# Patient Record
Sex: Male | Born: 1970 | Race: White | Hispanic: No | Marital: Married | Smoking: Never smoker
Health system: Southern US, Community
[De-identification: ages and names within clinical notes are randomized; demographics above are authoritative.]

## PROBLEM LIST (undated history)

## (undated) HISTORY — PX: PENECTOMY: SHX741

## (undated) HISTORY — PX: KNEE SURGERY: SHX244

## (undated) HISTORY — PX: OTHER SURGICAL HISTORY: SHX169

## (undated) HISTORY — PX: HAND SURGERY: SHX662

---

## 1999-04-24 ENCOUNTER — Other Ambulatory Visit: Admission: RE | Admit: 1999-04-24 | Discharge: 1999-04-24 | Payer: Self-pay | Admitting: Orthopedic Surgery

## 1999-10-21 ENCOUNTER — Emergency Department (HOSPITAL_COMMUNITY): Admission: EM | Admit: 1999-10-21 | Discharge: 1999-10-22 | Payer: Self-pay | Admitting: Emergency Medicine

## 1999-10-21 ENCOUNTER — Encounter: Payer: Self-pay | Admitting: Emergency Medicine

## 2004-09-12 ENCOUNTER — Ambulatory Visit (HOSPITAL_COMMUNITY): Admission: RE | Admit: 2004-09-12 | Discharge: 2004-09-12 | Payer: Self-pay | Admitting: *Deleted

## 2004-10-01 ENCOUNTER — Ambulatory Visit (HOSPITAL_BASED_OUTPATIENT_CLINIC_OR_DEPARTMENT_OTHER): Admission: RE | Admit: 2004-10-01 | Discharge: 2004-10-01 | Payer: Self-pay | Admitting: *Deleted

## 2007-10-26 ENCOUNTER — Emergency Department (HOSPITAL_COMMUNITY): Admission: EM | Admit: 2007-10-26 | Discharge: 2007-10-26 | Payer: Self-pay | Admitting: Emergency Medicine

## 2009-08-13 ENCOUNTER — Emergency Department (HOSPITAL_COMMUNITY): Admission: EM | Admit: 2009-08-13 | Discharge: 2009-08-13 | Payer: Self-pay | Admitting: Emergency Medicine

## 2010-09-07 LAB — DIFFERENTIAL
Basophils Absolute: 0 10*3/uL (ref 0.0–0.1)
Basophils Relative: 1 % (ref 0–1)
Eosinophils Absolute: 0.1 10*3/uL (ref 0.0–0.7)
Neutrophils Relative %: 54 % (ref 43–77)

## 2010-09-07 LAB — POCT CARDIAC MARKERS
CKMB, poc: 2 ng/mL (ref 1.0–8.0)
Troponin i, poc: <0.05 ng/mL (ref 0.00–0.09)

## 2010-09-07 LAB — URINALYSIS, ROUTINE W REFLEX MICROSCOPIC
Bilirubin Urine: NEGATIVE
Glucose, UA: NEGATIVE mg/dL
Nitrite: NEGATIVE
Specific Gravity, Urine: 1.011 (ref 1.005–1.030)
pH: 7 (ref 5.0–8.0)

## 2010-09-07 LAB — COMPREHENSIVE METABOLIC PANEL
ALT: 31 U/L (ref 0–53)
Alkaline Phosphatase: 72 U/L (ref 39–117)
BUN: 8 mg/dL (ref 6–23)
CO2: 29 mEq/L (ref 19–32)
GFR calc non Af Amer: >60 mL/min (ref >60)
Glucose, Bld: 99 mg/dL (ref 70–99)
Potassium: 4 mEq/L (ref 3.5–5.1)
Sodium: 138 mEq/L (ref 135–145)

## 2010-09-07 LAB — CBC
HCT: 46.1 % (ref 39.0–52.0)
Hemoglobin: 15.3 g/dL (ref 13.0–17.0)
MCHC: 33.1 g/dL (ref 30.0–36.0)
RBC: 5.15 MIL/uL (ref 4.22–5.81)

## 2010-09-07 LAB — LIPASE, BLOOD: Lipase: 68 U/L — ABNORMAL HIGH (ref 11–59)

## 2010-10-31 NOTE — Op Note (Signed)
NAMEFAHEEM, ZIEMANN NO.:  [id number]   MEDICAL RECORD NO.:  [id number]          PATIENT TYPE:  AMB   LOCATION:  DSC                          FACILITY:  [facility]   PHYSICIAN:  Lowell Bouton, M.D.DATE OF BIRTH:  [birth year]   DATE OF PROCEDURE:  10/01/2004  DATE OF DISCHARGE:                                 OPERATIVE REPORT   PREOPERATIVE DIAGNOSES:  1.  Hook of the hamate fracture, right wrist.  2.  Right carpal tunnel syndrome.   POSTOPERATIVE DIAGNOSES:  1.  Hook of the hamate fracture, right wrist.  2.  Right carpal tunnel syndrome.   PROCEDURE:  1.  Excision of hook of the hamate bone, right wrist.  2.  Right carpal tunnel release.   SURGEON:  Lowell Bouton, M.D.   ANESTHESIA:  General.   OPERATIVE FINDINGS:  The patient had a nonunion of a hamate fracture. There  was obvious looseness of the hook of the hamate. The carpal tunnel showed  significant fibrosis of the nerve with no masses identified and canal. The  motor branch of the nerve was intact.   PROCEDURE:  Under general anesthesia with a tourniquet on the right arm, the  right hand was prepped and draped in usual fashion. After exsanguinating the  limb, the tourniquet was inflated to 200 mmHg.  A V-shaped incision was made  over the hypothenar eminence overlying the hamate. Sharp dissection was  carried through the subcutaneous tissues and bleeding points were  coagulated. Blunt dissection was carried down to the neurovascular bundle  and the radial branches of the artery were coagulated with electrocautery.  This allowed for exposure of the hook of the hamate. The hypothenar palmaris  brevis muscle was divided to get down to the hamate bone. Blunt dissection  was carried along the ulnar nerve and the motor branch was identified,  diving down around the hook of the hamate. It was protected throughout the  procedure. The hook of the hamate was obviously loose and by sharp  dissection subperiosteally, the fragment was excised. A rongeur was used to  completely excise the remaining bone of the hook of the hamate. The base  showed evidence of nonunion. The wound was then irrigated.  A nerve  stimulator was used to test the ulnar motor branch function and it was  functioning. At this point it was felt that a second incision should be used  for the carpal tunnel release and so a 3 cm longitudinal incision was made  just along the thenar crease. Sharp dissection was carried through the  subcutaneous tissues. Bleeding points were coagulated. Blunt dissection was  carried down through the superficial palmar fascia and the transverse carpal  ligament was divided sharply. The division of the ligament was done on the  ulnar side of median nerve.  Proximally, the ligament was divided after  dissecting the nerve away from the undersurface of the ligament. The carpal  canal was then palpated and was found to be adequately decompressed. The  nerve was examined and the motor branch was identified. Both wounds were  irrigated  with saline and the carpal tunnel incision was closed with 4-0  nylon sutures. The second incision was  drained with a vessel loop drain and closed with 4-0 nylon sutures. Sterile  dressings were applied followed by a volar wrist splint. The tourniquet was  released with good circulation of the hand. The patient went to the recovery  room awake and stable in good condition.      EMM/MEDQ  D:  10/01/2004  T:  10/01/2004  Job:  [id number]

## 2011-10-13 ENCOUNTER — Encounter (HOSPITAL_COMMUNITY): Payer: Self-pay | Admitting: Emergency Medicine

## 2011-10-13 ENCOUNTER — Emergency Department (HOSPITAL_COMMUNITY)
Admission: EM | Admit: 2011-10-13 | Discharge: 2011-10-13 | Disposition: A | Discharge status: Home / Self Care | Payer: Managed Care, Other (non HMO) | Attending: Emergency Medicine | Admitting: Emergency Medicine

## 2011-10-13 DIAGNOSIS — M545 Low back pain, unspecified: Secondary | ICD-10-CM | POA: Insufficient documentation

## 2011-10-13 DIAGNOSIS — M549 Dorsalgia, unspecified: Secondary | ICD-10-CM | POA: Insufficient documentation

## 2011-10-13 MED ORDER — DIAZEPAM 5 MG PO TABS: 5.0000 mg | ORAL_TABLET | Freq: Two times a day (BID) | ORAL | Status: AC

## 2011-10-13 MED ORDER — ONDANSETRON 8 MG PO TBDP
8.0000 mg | ORAL_TABLET | Freq: Once | ORAL | Status: AC
  Administered 2011-10-13: 8 mg via ORAL
  Filled 2011-10-13: qty 1

## 2011-10-13 MED ORDER — ONDANSETRON HCL 4 MG PO TABS: 4.0000 mg | ORAL_TABLET | Freq: Four times a day (QID) | ORAL | Status: AC

## 2011-10-13 MED ORDER — HYDROMORPHONE HCL PF 2 MG/ML IJ SOLN
2.0000 mg | Freq: Once | INTRAMUSCULAR | Status: AC
  Administered 2011-10-13: 2 mg via INTRAVENOUS
  Filled 2011-10-13: qty 1

## 2011-10-13 MED ORDER — OXYCODONE-ACETAMINOPHEN 5-325 MG PO TABS: 1.0000 | ORAL_TABLET | Freq: Four times a day (QID) | ORAL | Status: AC | PRN

## 2011-10-13 NOTE — ED Provider Notes (Signed)
Medical screening examination/treatment/procedure(s) were performed by non-physician practitioner and as supervising physician I was immediately available for consultation/collaboration.   Lyanne Co, MD 10/13/11 [phone]

## 2011-10-13 NOTE — ED Notes (Signed)
Pt states he is having pain in his lower back right in the middle  Pt states pain started yesterday while at work  Denies any specific injury  Has had problems with his back in the past

## 2011-10-13 NOTE — ED Notes (Signed)
Pt unable to to lay flat or bend forward due to lower back pain

## 2011-10-13 NOTE — ED Provider Notes (Signed)
History     CSN: [id number]  Arrival date & time 10/13/11  0413   First MD Initiated Contact with Patient 10/13/11 [phone]      Chief Complaint  Patient presents with  . Back Pain    (Consider location/radiation/quality/duration/timing/severity/associated sxs/prior treatment) HPI  Pt presents to the ED with complaints of pain in her mid lower back. It started yesterday while he was at work but he denies any injury. 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 He currently admits to the pain being a 10/10. He is unable to bend forward and tie his shoe laces and is too uncomfortable to stand up straight. Pt denies bowel or urinary incontinence, numbness or tingling in his extremities.   History reviewed. No pertinent past medical history.  Past Surgical History  Procedure Date  . Hand surgery   . Left arm surgery    . Right hand surgery    . Carpel tunnel release    . Knee surgery   . Penectomy     Family History  Problem Relation Age of Onset  . Diabetes Other     History  Substance Use Topics  . Smoking status: Never Smoker   . Smokeless tobacco: Not on file  . Alcohol Use: No      Review of Systems   HEENT: denies blurry vision or change in hearing PULMONARY: Denies difficulty breathing and SOB CARDIAC: denies chest pain or heart palpitations MUSCULOSKELETAL:  denies being unable to ambulate ABDOMEN AL: denies abdominal pain GU: denies loss of bowel or urinary control NEURO: denies numbness and tingling in extremities   Allergies  Review of patient's allergies indicates no known allergies.  Home Medications   Current Outpatient Rx  Name Route Sig Dispense Refill  . DIAZEPAM 5 MG PO TABS Oral Take 1 tablet (5 mg total) by mouth 2 (two) times daily. 10 tablet 0  . ONDANSETRON HCL 4 MG PO TABS Oral Take 1 tablet (4 mg total) by mouth every 6 (six) hours. 12 tablet 0  .  OXYCODONE-ACETAMINOPHEN 5-325 MG PO TABS Oral Take 1 tablet by mouth every 6 (six) hours as needed for pain. 15 tablet 0    BP 123/80  Pulse 61  Resp 22  SpO2 99%  Physical Exam  Nursing note and vitals reviewed. Constitutional: He appears well-developed and well-nourished. No distress.  HENT:  Head: Normocephalic and atraumatic.  Eyes: Pupils are equal, round, and reactive to light.  Neck: Normal range of motion. Neck supple.  Cardiovascular: Normal rate and regular rhythm.   Pulmonary/Chest: Effort normal.  Abdominal: Soft.  Musculoskeletal:        Equal strength to bilateral lower extremities. Neurosensory function adequate to both legs. Skin color is normal. Skin is warm and moist. I see no step off deformity, no bony tenderness. Pt is able to ambulate without limp. Pain is relieved when sitting in certain positions. ROM is decreased due to pain. No crepitus, laceration, effusion, swelling.  Pulses are normal   Neurological: He is alert.  Skin: Skin is warm and dry.    ED Course  Procedures (including critical care time)  Labs Reviewed - No data to display No results found.   1. Low back pain       MDM    Patient with back pain. No neurological deficits. Patient is ambulatory. No warning symptoms of back pain including: loss of  bowel or bladder control, night sweats, waking from sleep with back pain, unexplained fevers or weight loss, h/o cancer, IVDU, recent trauma. No concern for cauda equina, epidural abscess, or other serious cause of back pain.   Pt given 2mg  IM Dilaudid with only minimal relief. After discussing with Dr. Patria Mane, our plan was to start an IV and administer pain medications and order an MRI to be done this morning. However, after discussing plan with patient, the patient does not want and MRI at this time because he is self  Pay.  We have decided to let the patient wait for about 30 minutes and then re-evaluate the pain. At this time he states he  will let me know if he wants to go home and see how he feels or stay in the ED and get his MRI.  PT will return to the ED if needed. At this time he would rather go home and see how he does with rest and pain medications.  Pt has been advised of the symptoms that warrant their return to the ED. Patient has voiced understanding and has agreed to follow-up with the PCP or specialist.         Dorthula Matas, PA 10/13/11 [phone]

## 2011-10-13 NOTE — Discharge Instructions (Signed)
Back Pain, Adult  Low back pain is very common. About 1 in 5 people have back pain.The cause of low back pain is rarely dangerous. The pain often gets better over time.About half of people with a sudden onset of back pain feel better in just 2 weeks. About 8 in 10 people feel better by 6 weeks.   CAUSES  Some common causes of back pain include:   Strain of the muscles or ligaments supporting the spine.   Wear and tear (degeneration) of the spinal discs.   Arthritis.   Direct injury to the back.  DIAGNOSIS  Most of the time, the direct cause of low back pain is not known.However, back pain can be treated effectively even when the exact cause of the pain is unknown.Answering your caregiver's questions about your overall health and symptoms is one of the most accurate ways to make sure the cause of your pain is not dangerous. If your caregiver needs more information, he or she may order lab work or imaging tests (X-rays or MRIs).However, even if imaging tests show changes in your back, this usually does not require surgery.  HOME CARE INSTRUCTIONS  For many people, back pain returns.Since low back pain is rarely dangerous, it is often a condition that people can learn to manageon their own.    Remain active. It is stressful on the back to sit or stand in one place. Do not sit, drive, or stand in one place for more than 30 minutes at a time. Take short walks on level surfaces as soon as pain allows.Try to increase the length of time you walk each day.   Do not stay in bed.Resting more than 1 or 2 days can delay your recovery.   Do not avoid exercise or work.Your body is made to move.It is not dangerous to be active, even though your back may hurt.Your back will likely heal faster if you return to being active before your pain is gone.   Pay attention to your body when you bend and lift. Many people have less discomfortwhen lifting if they bend their knees, keep the load close to their bodies,and  avoid twisting. Often, the most comfortable positions are those that put less stress on your recovering back.   Find a comfortable position to sleep. Use a firm mattress and lie on your side with your knees slightly bent. If you lie on your back, put a pillow under your knees.   Only take over-the-counter or prescription medicines as directed by your caregiver. Over-the-counter medicines to reduce pain and inflammation are often the most helpful.Your caregiver may prescribe muscle relaxant drugs.These medicines help dull your pain so you can more quickly return to your normal activities and healthy exercise.   Put ice on the injured area.   Put ice in a plastic bag.   Place a towel between your skin and the bag.   Leave the ice on for 15 to 20 minutes, 3 to 4 times a day for the first 2 to 3 days. After that, ice and heat may be alternated to reduce pain and spasms.   Ask your caregiver about trying back exercises and gentle massage. This may be of some benefit.   Avoid feeling anxious or stressed.Stress increases muscle tension and can worsen back pain.It is important to recognize when you are anxious or stressed and learn ways to manage it.Exercise is a great option.  SEEK MEDICAL CARE IF:   You have pain that is not   relieved with rest or medicine.   You have pain that does not improve in 1 week.   You have new symptoms.   You are generally not feeling well.  SEEK IMMEDIATE MEDICAL CARE IF:    You have pain that radiates from your back into your legs.   You develop new bowel or bladder control problems.   You have unusual weakness or numbness in your arms or legs.   You develop nausea or vomiting.   You develop abdominal pain.   You feel faint.  Document Released: 06/01/2005 Document Revised: 05/21/2011 Document Reviewed: 10/20/2010  ExitCare Patient Information 2012 [employer].    Lumbosacral Strain  Lumbosacral strain is one of the most common causes of back pain. There are many  causes of back pain. Most are not serious conditions.  CAUSES   Your backbone (spinal column) is made up of 24 main vertebral bodies, the sacrum, and the coccyx. These are held together by muscles and tough, fibrous tissue (ligaments). Nerve roots pass through the openings between the vertebrae. A sudden move or injury to the back may cause injury to, or pressure on, these nerves. This may result in localized back pain or pain movement (radiation) into the buttocks, down the leg, and into the foot. Sharp, shooting pain from the buttock down the back of the leg (sciatica) is frequently associated with a ruptured (herniated) disk. Pain may be caused by muscle spasm alone.  Your caregiver can often find the cause of your pain by the details of your symptoms and an exam. In some cases, you may need tests (such as X-rays). Your caregiver will work with you to decide if any tests are needed based on your specific exam.  HOME CARE INSTRUCTIONS    Avoid an underactive lifestyle. Active exercise, as directed by your caregiver, is your greatest weapon against back pain.   Avoid hard physical activities (tennis, racquetball, waterskiing) if you are not in proper physical condition for it. This may aggravate or create problems.   If you have a back problem, avoid sports requiring sudden body movements. Swimming and walking are generally safer activities.   Maintain good posture.   Avoid becoming overweight (obese).   Use bed rest for only the most extreme, sudden (acute) episode. Your caregiver will help you determine how much bed rest is necessary.   For acute conditions, you may put ice on the injured area.   Put ice in a plastic bag.   Place a towel between your skin and the bag.   Leave the ice on for 15 to 20 minutes at a time, every 2 hours, or as needed.   After you are improved and more active, it may help to apply heat for 30 minutes before activities.  See your caregiver if you are having pain that lasts  longer than expected. Your caregiver can advise appropriate exercises or therapy if needed. With conditioning, most back problems can be avoided.  SEEK IMMEDIATE MEDICAL CARE IF:    You have numbness, tingling, weakness, or problems with the use of your arms or legs.   You experience severe back pain not relieved with medicines.   There is a change in bowel or bladder control.   You have increasing pain in any area of the body, including your belly (abdomen).   You notice shortness of breath, dizziness, or feel faint.   You feel sick to your stomach (nauseous), are throwing up (vomiting), or become sweaty.   You   notice discoloration of your toes or legs, or your feet get very cold.   Your back pain is getting worse.   You have a fever.  MAKE SURE YOU:    Understand these instructions.   Will watch your condition.   Will get help right away if you are not doing well or get worse.  Document Released: 03/11/2005 Document Revised: 05/21/2011 Document Reviewed: 08/31/2008  ExitCare Patient Information 2012 [employer].

## 2012-03-09 ENCOUNTER — Ambulatory Visit: Payer: Self-pay

## 2012-03-09 ENCOUNTER — Other Ambulatory Visit: Payer: Self-pay | Admitting: Occupational Medicine

## 2012-03-09 DIAGNOSIS — M79643 Pain in unspecified hand: Secondary | ICD-10-CM

## 2017-06-29 DIAGNOSIS — K4090 Unilateral inguinal hernia, without obstruction or gangrene, not specified as recurrent: Secondary | ICD-10-CM | POA: Insufficient documentation

## 2018-06-13 ENCOUNTER — Other Ambulatory Visit: Payer: Self-pay | Admitting: Podiatry

## 2018-06-13 ENCOUNTER — Ambulatory Visit: Payer: 59 | Admitting: Podiatry

## 2018-06-13 ENCOUNTER — Ambulatory Visit (INDEPENDENT_AMBULATORY_CARE_PROVIDER_SITE_OTHER): Payer: 59

## 2018-06-13 VITALS — BP 152/93 | HR 62

## 2018-06-13 DIAGNOSIS — M19071 Primary osteoarthritis, right ankle and foot: Secondary | ICD-10-CM

## 2018-06-13 DIAGNOSIS — M79671 Pain in right foot: Secondary | ICD-10-CM

## 2018-06-13 DIAGNOSIS — M7751 Other enthesopathy of right foot: Secondary | ICD-10-CM | POA: Diagnosis not present

## 2018-06-15 NOTE — Progress Notes (Signed)
   Subjective:  48 year old male presenting today as a new patient with a chief complaint of right foot and ankle pain that began 3 months ago. He states he stepped on a nail and it went through the foot when the pain began. He states he has had plantar pain since the injury that is exacerbated by walking. He has been taking OTC NSAIDs and icing the area for treatment. He reports h/o a right ankle injury stating he twisted it 5 years ago and chipped a bone. Patient is here for further evaluation and treatment.   No past medical history on file.     Objective / Physical Exam:  General:  The patient is alert and oriented x3 in no acute distress. Dermatology:  Skin is warm, dry and supple bilateral lower extremities. Negative for open lesions or macerations. Vascular:  Palpable pedal pulses bilaterally. No edema or erythema noted. Capillary refill within normal limits. Neurological:  Epicritic and protective threshold grossly intact bilaterally.  Musculoskeletal Exam:  Pain on palpation to the anterior lateral medial aspects of the patient's right ankle. Mild edema noted. Range of motion within normal limits to all pedal and ankle joints bilateral. Muscle strength 5/5 in all groups bilateral.   Radiographic Exam:  Elongated 2nd metatarsal noted. Normal osseous mineralization. Joint spaces preserved. No fracture/dislocation/boney destruction.    Assessment: 1. Ankle DJD/synovitis right 2. 2nd MPJ capsulitis right   Plan of Care:  1. Patient was evaluated. X-Rays reviewed.  2. Continue taking oral NSAIDs and icing as needed.  3. Recommended good shoe gear.  4. Patient wants surgery in late summer. Surgery would consist of right ankle arthroscopy and shortening of the 2nd metatarsal of the right foot.  5. Return to clinic in 6 months for surgical consult.    Felecia Shelling, DPM [facility]  Dr. Felecia Shelling, DPM    [address]                                         [place], [place] [address]                Office [phone]  Fax [phone]

## 2018-09-28 DIAGNOSIS — M5136 Other intervertebral disc degeneration, lumbar region: Secondary | ICD-10-CM | POA: Insufficient documentation

## 2018-09-28 DIAGNOSIS — M51369 Other intervertebral disc degeneration, lumbar region without mention of lumbar back pain or lower extremity pain: Secondary | ICD-10-CM | POA: Insufficient documentation

## 2018-11-15 ENCOUNTER — Telehealth: Payer: Self-pay | Admitting: *Deleted

## 2018-11-15 NOTE — Telephone Encounter (Signed)
"  I was trying to find out how far back are the surgeries going to be, as far as getting surgery on my foot.  I've got an appointment the end of this month.  I think my doctor was Dr. Logan Bores.  I heard they were rescheduling and pushing back surgeries.  I'm trying to see about how far back it will be.  Can you give me a call?"

## 2018-11-18 NOTE — Telephone Encounter (Signed)
I attempted to call the patient back.  His home number is not in service.  I left him a message on his mobile phone, asking him to call me back on Monday.

## 2018-12-01 NOTE — Telephone Encounter (Signed)
I attempted to call the patient again.  I left him a message on his mobile phone.  I informed him that I was trying to return his call and I asked him to give me a call back.

## 2018-12-12 ENCOUNTER — Ambulatory Visit: Payer: 59 | Admitting: Podiatry

## 2018-12-12 ENCOUNTER — Other Ambulatory Visit: Payer: Self-pay

## 2018-12-12 ENCOUNTER — Ambulatory Visit (INDEPENDENT_AMBULATORY_CARE_PROVIDER_SITE_OTHER): Payer: 59

## 2018-12-12 ENCOUNTER — Encounter: Payer: Self-pay | Admitting: Podiatry

## 2018-12-12 VITALS — Temp 98.0°F

## 2018-12-12 DIAGNOSIS — M2041 Other hammer toe(s) (acquired), right foot: Secondary | ICD-10-CM

## 2018-12-12 DIAGNOSIS — M19071 Primary osteoarthritis, right ankle and foot: Secondary | ICD-10-CM

## 2018-12-12 DIAGNOSIS — M7751 Other enthesopathy of right foot: Secondary | ICD-10-CM | POA: Diagnosis not present

## 2018-12-12 NOTE — Patient Instructions (Signed)
Pre-Operative Instructions  Congratulations, you have decided to take an important step towards improving your quality of life.  You can be assured that the doctors and staff at [facility] will be with you every step of the way.  Here are some important things you should know:  1. Plan to be at the surgery center/hospital at least 1 (one) hour prior to your scheduled time, unless otherwise directed by the surgical center/hospital staff.  You must have a responsible adult accompany you, remain during the surgery and drive you home.  Make sure you have directions to the surgical center/hospital to ensure you arrive on time. 2. If you are having surgery at [facility] or [facility], you will need a copy of your medical history and physical form from your family physician within one month prior to the date of surgery. We will give you a form for your primary physician to complete.  3. We make every effort to accommodate the date you request for surgery.  However, there are times where surgery dates or times have to be moved.  We will contact you as soon as possible if a change in schedule is required.   4. No aspirin/ibuprofen for one week before surgery.  If you are on aspirin, any non-steroidal anti-inflammatory medications (Mobic, Aleve, Ibuprofen) should not be taken seven (7) days prior to your surgery.  You make take Tylenol for pain prior to surgery.  5. Medications - If you are taking daily heart and blood pressure medications, seizure, reflux, allergy, asthma, anxiety, pain or diabetes medications, make sure you notify the surgery center/hospital before the day of surgery so they can tell you which medications you should take or avoid the day of surgery. 6. No food or drink after midnight the night before surgery unless directed otherwise by surgical center/hospital staff. 7. No alcoholic beverages 24-hours prior to surgery.  No smoking 24-hours prior or 24-hours after  surgery. 8. Wear loose pants or shorts. They should be loose enough to fit over bandages, boots, and casts. 9. Don't wear slip-on shoes. Sneakers are preferred. 10. Bring your boot with you to the surgery center/hospital.  Also bring crutches or a walker if your physician has prescribed it for you.  If you do not have this equipment, it will be provided for you after surgery. 11. If you have not been contacted by the surgery center/hospital by the day before your surgery, call to confirm the date and time of your surgery. 12. Leave-time from work may vary depending on the type of surgery you have.  Appropriate arrangements should be made prior to surgery with your employer. 13. Prescriptions will be provided immediately following surgery by your doctor.  Fill these as soon as possible after surgery and take the medication as directed. Pain medications will not be refilled on weekends and must be approved by the doctor. 14. Remove nail polish on the operative foot and avoid getting pedicures prior to surgery. 15. Wash the night before surgery.  The night before surgery wash the foot and leg well with water and the antibacterial soap provided. Be sure to pay special attention to beneath the toenails and in between the toes.  Wash for at least three (3) minutes. Rinse thoroughly with water and dry well with a towel.  Perform this wash unless told not to do so by your physician.  Enclosed: 1 Ice pack (please put in freezer the night before surgery)   1 Hibiclens skin cleaner     Pre-op instructions  If you have any questions regarding the instructions, please do not hesitate to call our office.  [place]: [address], [place], [place] [address] -- [phone]  [place]: [address] [place], [place] [address] -- [phone]  [place]: [address]  [place], [place] [address] -- [phone]  [place]: [address], [address], [place],  [address] -- [phone]  Website:  [email or web address] 

## 2018-12-14 NOTE — Progress Notes (Signed)
   Subjective:  48 year old male presenting today for follow up evaluation of right ankle and 2nd MPJ pain. He reports continued pain although he has been taking oral NSAIDs for treatment. Walking increases the pain. Patient is here for further evaluation and treatment.   History reviewed. No pertinent past medical history.     Objective / Physical Exam:  General:  The patient is alert and oriented x3 in no acute distress. Dermatology:  Skin is warm, dry and supple bilateral lower extremities. Negative for open lesions or macerations. Vascular:  Palpable pedal pulses bilaterally. No edema or erythema noted. Capillary refill within normal limits. Neurological:  Epicritic and protective threshold grossly intact bilaterally.  Musculoskeletal Exam:  Pain on palpation to the anterior lateral medial aspects of the patient's right ankle. Mild edema noted. Hammertoe contracture deformity noted to the 2nd digit of the right foot. Range of motion within normal limits to all pedal and ankle joints bilateral. Muscle strength 5/5 in all groups bilateral.   Assessment: 1. Ankle DJD/synovitis right 2. 2nd MPJ capsulitis right  3. 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 Patient was evaluated.  2. Today we discussed the conservative versus surgical management of the presenting pathology. The patient opts for surgical management. All possible complications and details of the procedure were explained. All patient questions were answered. No guarantees were expressed or implied. 3. Authorization for surgery was initiated today. Surgery will consist of ankle arthroscopy right; weil/shortening osteotomy 2nd metatarsal right; possible PIPJ arthroplasty with percutaneous pin fixation 2nd right.  4. Return to clinic one week post op.   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    Edrick Kins, DPM [facility]  Dr. Edrick Kins, [address]                                        [place], [place] [address]                Office [phone]  Fax [phone]

## 2018-12-21 ENCOUNTER — Telehealth: Payer: Self-pay | Admitting: *Deleted

## 2018-12-21 NOTE — Telephone Encounter (Signed)
DOS 01/05/2019;  75051 - METATARSAL OSTEOTOMY 2ND, 07125 - ANKLE ARTHROSCOPY, AND 24799 - HAMMER TOE REPAIR 2ND RIGHT FOOT  [employer]: Effective Date - 06/15/2018 - 06/15/2019  Individual In-Network (Calendar Year) Deductible $0.00 MET YTD  $500.00 remaining  $500.00 Plan Amt.   Out-of-Pocket $124.95 MET YTD  $4,875.05 remaining  $5,000.00 Plan Amt.  Professional Fees for Surgical and Medical Services 95% of eligible expenses after satisfying $500 deductible.   The notification/prior authorization case information was transmitted on 12/21/2018 at 11:25 AM CDT. The notification/prior authorization reference number is [id number]

## 2018-12-28 NOTE — Telephone Encounter (Signed)
AUTHORIZATION # [id number]  COVERAGE STATUS  1-3 Code  Description  Coverage Status DECISION DATE    [facility]  Coverage determination is reflected for the facility admission and is not a guarantee of payment for ongoing services. Covered/Approved 12/21/2018  1 28285 Correction, hammertoe (eg, interphalange  Covered/Approved 12/21/2018  2 28308 Osteotomy, with or without lengthening,   Covered/Approved 12/21/2018  3 23343 Arthroscopy, ankle (tibiotalar and fibul  Covered/Approved 12/21/2018

## 2019-01-05 ENCOUNTER — Other Ambulatory Visit: Payer: Self-pay | Admitting: Podiatry

## 2019-01-05 DIAGNOSIS — M7751 Other enthesopathy of right foot: Secondary | ICD-10-CM | POA: Diagnosis not present

## 2019-01-05 DIAGNOSIS — M19071 Primary osteoarthritis, right ankle and foot: Secondary | ICD-10-CM | POA: Diagnosis not present

## 2019-01-05 MED ORDER — OXYCODONE-ACETAMINOPHEN 5-325 MG PO TABS: 1.0000 | ORAL_TABLET | Freq: Four times a day (QID) | ORAL | 0 refills | Status: DC | PRN

## 2019-01-05 NOTE — Progress Notes (Signed)
Post op

## 2019-01-09 ENCOUNTER — Telehealth: Payer: Self-pay

## 2019-01-09 ENCOUNTER — Encounter: Payer: Self-pay | Admitting: Podiatry

## 2019-01-09 NOTE — Telephone Encounter (Signed)
Called pt post surgery; pt stated, "doing alright; ace wrap gets kind of tight; leave boot loose; bandages still in tact, no excessive bleeding; feels better than I thought; been taking advil but nothing heavy"; instructed pt its ok to loosen ace wrap just a little but keep other bandages in tact; next post op appt is 01/12/2019 but to call before then if any questions or concerns.

## 2019-01-12 ENCOUNTER — Other Ambulatory Visit: Payer: Self-pay

## 2019-01-12 ENCOUNTER — Encounter: Payer: Self-pay | Admitting: Podiatry

## 2019-01-12 ENCOUNTER — Ambulatory Visit (INDEPENDENT_AMBULATORY_CARE_PROVIDER_SITE_OTHER): Payer: 59 | Admitting: Podiatry

## 2019-01-12 ENCOUNTER — Ambulatory Visit (INDEPENDENT_AMBULATORY_CARE_PROVIDER_SITE_OTHER): Payer: 59

## 2019-01-12 VITALS — Temp 98.1°F

## 2019-01-12 DIAGNOSIS — M2041 Other hammer toe(s) (acquired), right foot: Secondary | ICD-10-CM

## 2019-01-12 DIAGNOSIS — M7751 Other enthesopathy of right foot: Secondary | ICD-10-CM

## 2019-01-12 NOTE — Progress Notes (Signed)
Subjective:   Patient ID: Gabriel Hawkins, male   DOB: 48 y.o.   MRN: [id number]   HPI Patient presents stating I seem to be doing pretty well with surgery with mild discomfort but wearing the boot well   ROS      Objective:  Physical Exam  Neurovascular status intact with patient's right foot incision sites healing well wound edges well coapted stitches intact     Assessment:  Doing well post osteotomy ankle arthroscopy right     Plan:  H&P condition reviewed and sterile dressing reapplied.  Gave instructions for continued elevation compression immobilization and reappoint for Dr. Amalia Hailey to reevaluate  X-ray indicates satisfactory shortening second metatarsal right with screw in place

## 2019-01-18 ENCOUNTER — Ambulatory Visit (INDEPENDENT_AMBULATORY_CARE_PROVIDER_SITE_OTHER): Payer: 59 | Admitting: Podiatry

## 2019-01-18 ENCOUNTER — Encounter: Payer: Self-pay | Admitting: Podiatry

## 2019-01-18 VITALS — Temp 98.0°F

## 2019-01-18 DIAGNOSIS — M7751 Other enthesopathy of right foot: Secondary | ICD-10-CM | POA: Diagnosis not present

## 2019-01-18 DIAGNOSIS — M2041 Other hammer toe(s) (acquired), right foot: Secondary | ICD-10-CM

## 2019-01-18 DIAGNOSIS — Z09 Encounter for follow-up examination after completed treatment for conditions other than malignant neoplasm: Secondary | ICD-10-CM

## 2019-01-21 ENCOUNTER — Encounter: Payer: Self-pay | Admitting: Podiatry

## 2019-01-21 NOTE — Progress Notes (Signed)
Patient called and stated he was progressed to a surgical shoe. Now having some intermittent numbness and tingling on the outside of the foot.  Discussed with patient immobilization in his boot for a few days to prevent further neuritis.  Advised to call back if it worsens or he has other concerns.   F/u with Dr. Amalia Hailey as scheduled.

## 2019-01-21 NOTE — Progress Notes (Signed)
   Subjective:  Patient presents today status post ankle arthroscopy and 2nd met osteotomy right. DOS: 01/05/2019. He states he is doing well. He reports a significant improvement in the foot this week as opposed to his last visit. He has been using the CAM boot as directed. There are no worsening factors noted. Patient is here for further evaluation and treatment.    History reviewed. No pertinent past medical history.    Objective/Physical Exam Neurovascular status intact.  Skin incisions appear to be well coapted with sutures and staples intact. No sign of infectious process noted. No dehiscence. No active bleeding noted. Moderate edema noted to the surgical extremity.  Assessment: 1. s/p ankle arthroscopy and 2nd met osteotomy right. DOS: 01/05/2019   Plan of Care:  1. Patient was evaluated.  2. Post op shoe dispensed. Discontinue using CAM boot.  3. Compression anklet dispensed.  4. Return to clinic in 2 weeks.    Edrick Kins, DPM [facility]  Dr. Edrick Kins, [address]                                        [place], [place] [address]                Office [phone]  Fax [phone]

## 2019-01-24 DIAGNOSIS — M25519 Pain in unspecified shoulder: Secondary | ICD-10-CM | POA: Insufficient documentation

## 2019-01-25 ENCOUNTER — Encounter (HOSPITAL_COMMUNITY): Payer: Self-pay | Admitting: Emergency Medicine

## 2019-01-25 ENCOUNTER — Emergency Department (HOSPITAL_COMMUNITY): Payer: 59

## 2019-01-25 ENCOUNTER — Emergency Department (HOSPITAL_COMMUNITY)
Admission: EM | Admit: 2019-01-25 | Discharge: 2019-01-25 | Disposition: A | Discharge status: Home / Self Care | Payer: 59 | Attending: Emergency Medicine | Admitting: Emergency Medicine

## 2019-01-25 ENCOUNTER — Other Ambulatory Visit: Payer: Self-pay

## 2019-01-25 DIAGNOSIS — R1013 Epigastric pain: Secondary | ICD-10-CM | POA: Diagnosis present

## 2019-01-25 DIAGNOSIS — K2900 Acute gastritis without bleeding: Secondary | ICD-10-CM | POA: Insufficient documentation

## 2019-01-25 DIAGNOSIS — M5412 Radiculopathy, cervical region: Secondary | ICD-10-CM | POA: Insufficient documentation

## 2019-01-25 DIAGNOSIS — R112 Nausea with vomiting, unspecified: Secondary | ICD-10-CM | POA: Insufficient documentation

## 2019-01-25 LAB — COMPREHENSIVE METABOLIC PANEL
ALT: 34 U/L (ref 0–44)
AST: 28 U/L (ref 15–41)
Albumin: 4.8 g/dL (ref 3.5–5.0)
Alkaline Phosphatase: 89 U/L (ref 38–126)
Anion gap: 17 — ABNORMAL HIGH (ref 5–15)
BUN: 18 mg/dL (ref 6–20)
CO2: 19 mmol/L — ABNORMAL LOW (ref 22–32)
Calcium: 10.3 mg/dL (ref 8.9–10.3)
Chloride: 102 mmol/L (ref 98–111)
Creatinine, Ser: 1.32 mg/dL — ABNORMAL HIGH (ref 0.61–1.24)
GFR calc Af Amer: >60 mL/min (ref >60)
GFR calc non Af Amer: >60 mL/min (ref >60)
Glucose, Bld: 165 mg/dL — ABNORMAL HIGH (ref 70–99)
Potassium: 3.5 mmol/L (ref 3.5–5.1)
Sodium: 138 mmol/L (ref 135–145)
Total Bilirubin: 0.8 mg/dL (ref 0.3–1.2)
Total Protein: 8.6 g/dL — ABNORMAL HIGH (ref 6.5–8.1)

## 2019-01-25 LAB — CBC
HCT: 51.1 % (ref 39.0–52.0)
Hemoglobin: 17.4 g/dL — ABNORMAL HIGH (ref 13.0–17.0)
MCH: 30.2 pg (ref 26.0–34.0)
MCHC: 34.1 g/dL (ref 30.0–36.0)
MCV: 88.7 fL (ref 80.0–100.0)
Platelets: 280 10*3/uL (ref 150–400)
RBC: 5.76 MIL/uL (ref 4.22–5.81)
RDW: 12.3 % (ref 11.5–15.5)
WBC: 15.7 10*3/uL — ABNORMAL HIGH (ref 4.0–10.5)
nRBC: 0 % (ref 0.0–0.2)

## 2019-01-25 LAB — URINALYSIS, ROUTINE W REFLEX MICROSCOPIC
Bacteria, UA: NONE SEEN
Bilirubin Urine: NEGATIVE
Glucose, UA: NEGATIVE mg/dL
Hgb urine dipstick: NEGATIVE
Ketones, ur: 20 mg/dL — AB
Leukocytes,Ua: NEGATIVE
Nitrite: NEGATIVE
Protein, ur: 30 mg/dL — AB
Specific Gravity, Urine: >1.046 — ABNORMAL HIGH (ref 1.005–1.030)
pH: 6 (ref 5.0–8.0)

## 2019-01-25 LAB — LIPASE, BLOOD: Lipase: 26 U/L (ref 11–51)

## 2019-01-25 LAB — TROPONIN I (HIGH SENSITIVITY)
Troponin I (High Sensitivity): 6 ng/L (ref <18)
Troponin I (High Sensitivity): 4 ng/L (ref <18)

## 2019-01-25 LAB — LACTIC ACID, PLASMA
Lactic Acid, Venous: 4 mmol/L (ref 0.5–1.9)
Lactic Acid, Venous: 1.8 mmol/L (ref 0.5–1.9)

## 2019-01-25 MED ORDER — SUCRALFATE 1 G PO TABS: 1.0000 g | ORAL_TABLET | Freq: Three times a day (TID) | ORAL | 0 refills | Status: AC

## 2019-01-25 MED ORDER — FENTANYL CITRATE (PF) 100 MCG/2ML IJ SOLN
50.0000 ug | INTRAMUSCULAR | Status: DC | PRN
  Administered 2019-01-25: 09:00:00 50 ug via INTRAVENOUS
  Filled 2019-01-25 (×2): qty 2

## 2019-01-25 MED ORDER — DIPHENHYDRAMINE HCL 50 MG/ML IJ SOLN
12.5000 mg | Freq: Once | INTRAMUSCULAR | Status: AC
  Administered 2019-01-25: 13:00:00 12.5 mg via INTRAVENOUS
  Filled 2019-01-25: qty 1

## 2019-01-25 MED ORDER — LIDOCAINE VISCOUS HCL 2 % MT SOLN: 15.0000 mL | Freq: Once | OROMUCOSAL | Status: DC

## 2019-01-25 MED ORDER — SODIUM CHLORIDE 0.9% FLUSH
3.0000 mL | Freq: Once | INTRAVENOUS | Status: AC
  Administered 2019-01-25: 10:00:00 3 mL via INTRAVENOUS

## 2019-01-25 MED ORDER — OXYCODONE HCL 5 MG PO TABS: 2.5000 mg | ORAL_TABLET | Freq: Four times a day (QID) | ORAL | 0 refills | Status: AC | PRN

## 2019-01-25 MED ORDER — ALUM & MAG HYDROXIDE-SIMETH 200-200-20 MG/5ML PO SUSP: 30.0000 mL | Freq: Once | ORAL | Status: DC

## 2019-01-25 MED ORDER — PROCHLORPERAZINE EDISYLATE 10 MG/2ML IJ SOLN
10.0000 mg | Freq: Once | INTRAMUSCULAR | Status: AC
  Administered 2019-01-25: 13:00:00 10 mg via INTRAVENOUS
  Filled 2019-01-25: qty 2

## 2019-01-25 MED ORDER — IOHEXOL 300 MG/ML  SOLN
100.0000 mL | Freq: Once | INTRAMUSCULAR | Status: AC | PRN
  Administered 2019-01-25: 11:00:00 100 mL via INTRAVENOUS

## 2019-01-25 MED ORDER — FAMOTIDINE 20 MG PO TABS: 20.0000 mg | ORAL_TABLET | Freq: Two times a day (BID) | ORAL | 0 refills | Status: AC

## 2019-01-25 MED ORDER — SODIUM CHLORIDE 0.9 % IV BOLUS
1500.0000 mL | Freq: Once | INTRAVENOUS | Status: AC
  Administered 2019-01-25: 13:00:00 1500 mL via INTRAVENOUS

## 2019-01-25 MED ORDER — FAMOTIDINE IN NACL 20-0.9 MG/50ML-% IV SOLN
20.0000 mg | Freq: Once | INTRAVENOUS | Status: AC
  Administered 2019-01-25: 10:00:00 20 mg via INTRAVENOUS
  Filled 2019-01-25: qty 50

## 2019-01-25 MED ORDER — ONDANSETRON HCL 4 MG/2ML IJ SOLN
4.0000 mg | Freq: Once | INTRAMUSCULAR | Status: AC
  Administered 2019-01-25: 4 mg via INTRAVENOUS
  Filled 2019-01-25: qty 2

## 2019-01-25 MED ORDER — ONDANSETRON 4 MG PO TBDP: 4.0000 mg | ORAL_TABLET | Freq: Once | ORAL | Status: DC | PRN

## 2019-01-25 MED ORDER — ONDANSETRON HCL 4 MG PO TABS: 4.0000 mg | ORAL_TABLET | Freq: Four times a day (QID) | ORAL | 0 refills | Status: AC | PRN

## 2019-01-25 MED ORDER — HYDROMORPHONE HCL 1 MG/ML IJ SOLN
0.5000 mg | Freq: Once | INTRAMUSCULAR | Status: AC
  Administered 2019-01-25: 13:00:00 0.5 mg via INTRAVENOUS
  Filled 2019-01-25: qty 1

## 2019-01-25 MED ORDER — SODIUM CHLORIDE 0.9 % IV BOLUS
1000.0000 mL | Freq: Once | INTRAVENOUS | Status: AC
  Administered 2019-01-25: 10:00:00 1000 mL via INTRAVENOUS

## 2019-01-25 NOTE — ED Triage Notes (Signed)
Patient reports sudden onset generalized abdominal pain, N/V/D since yesterday, states began shortly after eating something and feels it is food poisoning. Denies fevers/chills, sick contacts, or recent travel. Patient appears uncomfortable in triage.

## 2019-01-25 NOTE — ED Notes (Signed)
Patient Alert and oriented to baseline. Stable and ambulatory to baseline. Patient verbalized understanding of the discharge instructions.  Patient belongings were taken by the patient.   

## 2019-01-25 NOTE — ED Notes (Signed)
Pt being transported to CT

## 2019-01-25 NOTE — Discharge Instructions (Addendum)
1) please DISCONTINUE using your prednisone 2) Avoid alcohol and medicines in the NSAID class (motrin, aleve, aspirin) 3) avoid all spicy foods, heavy or greasy foods.  Abdominal (belly) pain can be caused by many things. Your caregiver performed an examination and possibly ordered blood/urine tests and imaging (CT scan, x-rays, ultrasound). Many cases can be observed and treated at home after initial evaluation in the emergency department. Even though you are being discharged home, abdominal pain can be unpredictable. Therefore, you need a repeated exam if your pain does not resolve, returns, or worsens. Most patients with abdominal pain don't have to be admitted to the hospital or have surgery, but serious problems like appendicitis and gallbladder attacks can start out as nonspecific pain. Many abdominal conditions cannot be diagnosed in one visit, so follow-up evaluations are very important. SEEK IMMEDIATE MEDICAL ATTENTION IF: The pain does not go away or becomes severe.  A temperature above 101 develops.  Repeated vomiting occurs (multiple episodes).  The pain becomes localized to portions of the abdomen. The right side could possibly be appendicitis. In an adult, the left lower portion of the abdomen could be colitis or diverticulitis.  Blood is being passed in stools or vomit (bright red or black tarry stools).  Return also if you develop chest pain, difficulty breathing, dizziness or fainting, or become confused, poorly responsive, or inconsolable (young children).

## 2019-01-25 NOTE — ED Notes (Signed)
Pt able intake 240 cc of water. Pt states he is feeling a lot better. Pt resting, wife at bedside.

## 2019-01-25 NOTE — ED Provider Notes (Signed)
[facility] EMERGENCY [facility]PARTMENT Provider Note   CSN: [id number] Arrival date & time: 01/25/19  [id number]    History   Chief Complaint Chief Complaint  Patient presents with  . Abdominal Pain    HPI Gabriel Hawkins is a 48 y.o. male presents to the emergency department with chief complaint of epigastric abdominal pain.  He has no significant past medical history.  He has multiple orthopedic.  Patient states he had onset of severe epigastric pain radiating into his chest with innumerable episodes of vomiting beginning last night around 7 PM after he ate dinner.  He complains of severe abdominal pain.  He has never had anything like this before.  He denies any shortness of breath.  He has had 2 episodes of diarrhea and loose stool.  He thinks that he is having food poisoning.  He was unable to hold down any food or fluids prior to arrival this morning.     HPI  History reviewed. No pertinent past medical history.  Patient Active Problem List   Diagnosis Date Noted  . Left inguinal hernia 06/29/2017    Past Surgical History:  Procedure Laterality Date  . carpel tunnel release     . HAND SURGERY    . KNEE SURGERY    . left arm surgery     . PENECTOMY    . right hand surgery           Home Medications    Prior to Admission medications   Medication Sig Start Date End Date Taking? Authorizing Provider  predniSONE (DELTASONE) 10 MG tablet Take 10 mg by mouth See admin instructions. Take 1 tablet by mouth 3 times a day for 7 days, 1 tablet by mouth twice daily for 7 days, 1 tablet for 7 days   Yes [provider]  famotidine (PEPCID) 20 MG tablet Take 1 tablet (20 mg total) by mouth 2 (two) times daily. 01/25/19   Alexias Margerum, Cammy CopaAbigail, PA-C  ondansetron (ZOFRAN) 4 MG tablet Take 1 tablet (4 mg total) by mouth 4 (four) times daily as needed for nausea or vomiting. 01/25/19   Arthor CaptainHarris, Dontrelle Mazon, PA-C  oxyCODONE (ROXICODONE) 5 MG immediate release tablet Take 0.5-1  tablets (2.5-5 mg total) by mouth every 6 (six) hours as needed for severe pain. 01/25/19   Janele Lague, Cammy CopaAbigail, PA-C  sucralfate (CARAFATE) 1 g tablet Take 1 tablet (1 g total) by mouth 4 (four) times daily -  with meals and at bedtime. 01/25/19   Arthor CaptainHarris, Vann Okerlund, PA-C    Family History Family History  Problem Relation Age of Onset  . Diabetes Other     Social History Social History   Tobacco Use  . Smoking status: Never Smoker  . Smokeless tobacco: Never Used  Substance Use Topics  . Alcohol use: No  . Drug use: No     Allergies   Patient has no known allergies.   Review of Systems Review of Systems Ten systems reviewed and are negative for acute change, except as noted in the HPI.    Physical Exam Updated Vital Signs BP 140/86 (BP Location: Right Arm)   Pulse 64   Temp 97.9 F (36.6 C) (Oral)   Resp 17   SpO2 99%   Physical Exam Vitals signs and nursing note reviewed.  Constitutional:      General: He is not in acute distress.    Appearance: He is well-developed. He is not diaphoretic.  HENT:     Head: Normocephalic and  atraumatic.  Eyes:     General: No scleral icterus.    Conjunctiva/sclera: Conjunctivae normal.  Neck:     Musculoskeletal: Normal range of motion and neck supple.  Cardiovascular:     Rate and Rhythm: Normal rate and regular rhythm.     Heart sounds: Normal heart sounds.  Pulmonary:     Effort: Pulmonary effort is normal. No respiratory distress.     Breath sounds: Normal breath sounds.  Abdominal:     General: There is no distension.     Palpations: Abdomen is soft.     Tenderness: There is generalized abdominal tenderness. There is guarding. There is no right CVA tenderness or left CVA tenderness.     Comments: Abdomen is exquisitely tender to palpation.  Skin:    General: Skin is warm and dry.  Neurological:     Mental Status: He is alert.  Psychiatric:        Behavior: Behavior normal.      ED Treatments / Results  Labs (all  labs ordered are listed, but only abnormal results are displayed) Labs Reviewed  COMPREHENSIVE METABOLIC PANEL - Abnormal; Notable for the following components:      Result Value   CO2 19 (*)    Glucose, Bld 165 (*)    Creatinine, Ser 1.32 (*)    Total Protein 8.6 (*)    Anion gap 17 (*)    All other components within normal limits  CBC - Abnormal; Notable for the following components:   WBC 15.7 (*)    Hemoglobin 17.4 (*)    All other components within normal limits  URINALYSIS, ROUTINE W REFLEX MICROSCOPIC - Abnormal; Notable for the following components:   Specific Gravity, Urine >1.046 (*)    Ketones, ur 20 (*)    Protein, ur 30 (*)    All other components within normal limits  LACTIC ACID, PLASMA - Abnormal; Notable for the following components:   Lactic Acid, Venous 4.0 (*)    All other components within normal limits  LIPASE, BLOOD  LACTIC ACID, PLASMA  TROPONIN I (HIGH SENSITIVITY)  TROPONIN I (HIGH SENSITIVITY)    EKG EKG Interpretation  Date/Time:  Wednesday January 25 2019 10:05:51 EDT Ventricular Rate:  69 PR Interval:    QRS Duration: 109 QT Interval:  428 QTC Calculation: 459 R Axis:   16 Text Interpretation:  Sinus rhythm Baseline wander in lead(s) V6 Confirmed by Pricilla LovelessGoldston, Scott [phone]([id number]) on 01/25/2019 11:21:07 AM   Radiology Ct Abdomen Pelvis W Contrast  Result Date: 01/25/2019 CLINICAL DATA:  Nausea, vomiting and acute abdomen pain. EXAM: CT ABDOMEN AND PELVIS WITH CONTRAST TECHNIQUE: Multidetector CT imaging of the abdomen and pelvis was performed using the standard protocol following bolus administration of intravenous contrast. CONTRAST:  100mL OMNIPAQUE IOHEXOL 300 MG/ML  SOLN COMPARISON:  Patient's prior CT scan the abdomen from Oct 26, 2007 is not available on PACs for comparison. FINDINGS: Lower chest: Mild dependent atelectasis is identified in the lung bases. The heart size is normal. Hepatobiliary: No focal liver abnormality is seen. No gallstones,  gallbladder wall thickening, or biliary dilatation. Pancreas: Unremarkable. No pancreatic ductal dilatation or surrounding inflammatory changes. Spleen: Normal in size without focal abnormality. Adrenals/Urinary Tract: Adrenal glands are unremarkable. Kidneys are normal, without renal calculi, focal lesion, or hydronephrosis. Bladder is unremarkable. Stomach/Bowel: Stomach is within normal limits. Appendix appears normal. No evidence of bowel wall thickening, distention, or inflammatory changes. Vascular/Lymphatic: No significant vascular findings are present. No enlarged abdominal or pelvic  lymph nodes. Reproductive: Prostate calcifications are identified. Other: None. Musculoskeletal: Mild degenerative joint changes of the spine are noted. IMPRESSION: No acute abnormality identified in the abdomen and pelvis. Electronically Signed   By: Sherian ReinWei-Chen  Lin M.D.   On: 01/25/2019 11:24    Procedures Procedures (including critical care time)  Medications Ordered in ED Medications  sodium chloride flush (NS) 0.9 % injection 3 mL (3 mLs Intravenous Given 01/25/19 1005)  ondansetron (ZOFRAN) injection 4 mg (4 mg Intravenous Given 01/25/19 0924)  famotidine (PEPCID) IVPB 20 mg premix (0 mg Intravenous Stopped 01/25/19 1041)  sodium chloride 0.9 % bolus 1,000 mL (0 mLs Intravenous Stopped 01/25/19 1352)  iohexol (OMNIPAQUE) 300 MG/ML solution 100 mL (100 mLs Intravenous Contrast Given 01/25/19 1101)  sodium chloride 0.9 % bolus 1,500 mL (0 mLs Intravenous Stopped 01/25/19 1352)  prochlorperazine (COMPAZINE) injection 10 mg (10 mg Intravenous Given 01/25/19 1238)  diphenhydrAMINE (BENADRYL) injection 12.5 mg (12.5 mg Intravenous Given 01/25/19 1247)  HYDROmorphone (DILAUDID) injection 0.5 mg (0.5 mg Intravenous Given 01/25/19 1247)     Initial Impression / Assessment and Plan / ED Course  I have reviewed the triage vital signs and the nursing notes.  Pertinent labs & imaging results that were available during my  care of the patient were reviewed by me and considered in my medical decision making (see chart for details).  Clinical Course as of Jan 25 730  Wed Jan 25, 2019  1036 Glucose(!): 165 [AH]  1036 Creatinine(!): 1.32 [AH]  1036 CO2(!): 19 [AH]  1036 WBC(!): 15.7 [AH]  1037 Lipase, blood [AH]  1038 Troponin I (High Sensitivity): 6 [AH]  1038 Lipase: 26 [AH]  1218 Lactic Acid, Venous(!!): 4.0 [AH]  1218 WBC(!): 15.7 [AH]  1227 Patient has elevated white count and lactic acidosis. He does NOT fit sepsis criteria and I believe this is secondary to dehydration. Patient will be given 9230ml/kg.    [AH]  1526 Lactic acid has improved    [AH]    Clinical Course User Index [AH] Damondre Pfeifle, PA-C       CC: abd pain and vomiting VS: BP 140/86 (BP Location: Right Arm)   Pulse 64   Temp 97.9 F (36.6 C) (Oral)   Resp 17   SpO2 99%  ZO:[id number]:History is gathered by patient and EMR. DDX:The emergent differential diagnosis for vomiting includes, but is not limited to ACS/MI, Boerhaave's, DKA, Intracranial Hemorrhage, Ischemic bowel, Meningitis, Sepsis, Acute radiation syndrome, Acute gastric dilation, Acetaminophen toxicity, Adrenal insufficiency, Appendicitis, Aspirin toxicity, Bowel obstruction/ileus, Carbon monoxide poisoning, Cholecystitis, CNS tumor. Digoxin toxicity, Electrolyte abnormalities, Elevated ICP, Gastric outlet obstruction, Hyperemesis gravidarum, Pancreatitis, Peritonitis, Ruptured viscus, Testicular torsion/ovarian torsion, Theophyline toxicity, Biliary colic, Cannabinoid hyperemesis syndrome, Chemotherapy, Disulfiram effect, Erythromycin, ETOH, Gastritis, Gastroenteritis, Gastroparesis, Hepatitis, Ibuprofen, Ipecac toxicity, Labyrinthitis, Migraine, Motion sickness, Narcotic withdrawal, Thyroid, Pregnancy, Peptic ulcer disease, Renal colic, and UTI Labs: I reviewed the labs which show elevated lactic acid - resolved with 4530ml/kg fluid bolus, anion gap of 17, UA with ketones.  Protein, high specific gravity indicating dehydration. CMP with low bicarb in the setting of lactic acidosis, elevated glucose and serum creatinine without elevation of BUN.  Leukocytosis of 15.7, troponin neg x2.  Imaging: I personally reviewed the images (Ct abd/pel w contrast) which show(s) No abnormalities EKG:  EKG Interpretation  Date/Time:  Wednesday January 25 2019 10:05:51 EDT Ventricular Rate:  69 PR Interval:    QRS Duration: 109 QT Interval:  428 QTC Calculation: 459 R Axis:   16 Text  Interpretation:  Sinus rhythm Baseline wander in lead(s) V6 Confirmed by Sherwood Gambler [id number]) on 01/25/2019 11:21:07 AM       WKM:QKMMNOT with epigastric painand vomiting. No diarrhea here. Patient states that he was started on prednisone for a shoulder problem yesterday- I believe this may account for leukocytosis, hyperglycemia and gastritis.Also may be secondary to acute phase reaction. Patient does not appear to have ACS as the cause. His sxs and his abdominal exam have improved significantly. He is tolerating PO fluids. Patient case reviewed and seen in shared visit with Dr. Ralene Bathe who agrees patient is safe for discharge with strict return precautions. The patient seems reliable to return. He is advised to discontinue his prednisone. He denies ETOH or NSAID use and is advised to avoid these as well as heavy, greasy or spicy foods. The patient appears well and appropriate for dc. Patient disposition: discharge Patient condition: fair. The patient appears reasonably screened and/or stabilized for discharge and I doubt any other medical condition or other [facility] requiring further screening, evaluation, or treatment in the ED at this time prior to discharge. I have discussed lab and/or imaging findings with the patient and answered all questions/concerns to the best of my ability. I have discussed return precautions and OP follow up.      Final Clinical Impressions(s) / ED Diagnoses   Final  diagnoses:  Acute gastritis without hemorrhage, unspecified gastritis type  Non-intractable vomiting with nausea, unspecified vomiting type    ED Discharge Orders         Ordered    ondansetron (ZOFRAN) 4 MG tablet  4 times daily PRN     01/25/19 1622    oxyCODONE (ROXICODONE) 5 MG immediate release tablet  Every 6 hours PRN     01/25/19 1622    famotidine (PEPCID) 20 MG tablet  2 times daily     01/25/19 1622    sucralfate (CARAFATE) 1 g tablet  3 times daily with meals & bedtime     01/25/19 1622           Margarita Mail, PA-C 01/26/19 7711    Sherwood Gambler, MD 01/26/19 1521

## 2019-02-01 ENCOUNTER — Encounter: Payer: Self-pay | Admitting: Podiatry

## 2019-02-01 ENCOUNTER — Other Ambulatory Visit: Payer: Self-pay

## 2019-02-01 ENCOUNTER — Ambulatory Visit (INDEPENDENT_AMBULATORY_CARE_PROVIDER_SITE_OTHER): Payer: 59 | Admitting: Podiatry

## 2019-02-01 ENCOUNTER — Ambulatory Visit (INDEPENDENT_AMBULATORY_CARE_PROVIDER_SITE_OTHER): Payer: 59

## 2019-02-01 VITALS — Temp 97.8°F

## 2019-02-01 DIAGNOSIS — M2041 Other hammer toe(s) (acquired), right foot: Secondary | ICD-10-CM

## 2019-02-01 DIAGNOSIS — M7751 Other enthesopathy of right foot: Secondary | ICD-10-CM

## 2019-02-01 DIAGNOSIS — M19071 Primary osteoarthritis, right ankle and foot: Secondary | ICD-10-CM

## 2019-02-01 DIAGNOSIS — Z09 Encounter for follow-up examination after completed treatment for conditions other than malignant neoplasm: Secondary | ICD-10-CM

## 2019-02-03 NOTE — Progress Notes (Signed)
   Subjective:  Patient presents today status post ankle arthroscopy and 2nd met osteotomy right. DOS: 01/05/2019. He states he is doing well. He denies any significant pain or modifying factors. He states he is tired of using the CAM boot. He reports trying to use the post op shoe but states it does not provide enough support of the ankle. Patient is here for further evaluation and treatment.    History reviewed. No pertinent past medical history.    Objective/Physical Exam Neurovascular status intact.  Skin incisions appear to be well coapted with sutures and staples intact. No sign of infectious process noted. No dehiscence. No active bleeding noted. Moderate edema noted to the surgical extremity.  Radiographic Exam:  Osteotomies sites appear to be stable with routine healing.   Assessment: 1. s/p ankle arthroscopy and 2nd met osteotomy right. DOS: 01/05/2019   Plan of Care:  1. Patient was evaluated. X-Rays reviewed.  2. Ankle brace dispensed.  3. Discontinue using CAM boot and post op shoe.  4. Return to clinic in 4 weeks.     Edrick Kins, DPM [facility]  Dr. Edrick Kins, [address]                                        [place],  [address]                Office [phone]  Fax [phone]

## 2019-03-01 ENCOUNTER — Ambulatory Visit (INDEPENDENT_AMBULATORY_CARE_PROVIDER_SITE_OTHER): Payer: 59 | Admitting: Podiatry

## 2019-03-01 ENCOUNTER — Encounter: Payer: Self-pay | Admitting: Podiatry

## 2019-03-01 ENCOUNTER — Ambulatory Visit (INDEPENDENT_AMBULATORY_CARE_PROVIDER_SITE_OTHER): Payer: 59

## 2019-03-01 ENCOUNTER — Other Ambulatory Visit: Payer: Self-pay

## 2019-03-01 DIAGNOSIS — Z09 Encounter for follow-up examination after completed treatment for conditions other than malignant neoplasm: Secondary | ICD-10-CM

## 2019-03-01 DIAGNOSIS — M19071 Primary osteoarthritis, right ankle and foot: Secondary | ICD-10-CM | POA: Diagnosis not present

## 2019-03-04 NOTE — Progress Notes (Signed)
   Subjective:  Patient presents today status post ankle arthroscopy and 2nd met osteotomy right. DOS: 01/05/2019. He states he is doing well. He reports only intermittent pain. He has been using the ankle brace as directed. He denies any worsening factors. Patient is here for further evaluation and treatment.    History reviewed. No pertinent past medical history.    Objective/Physical Exam Neurovascular status intact.  Skin incisions appear to be well coapted. No sign of infectious process noted. No dehiscence. No active bleeding noted. Moderate edema noted to the surgical extremity.  Radiographic Exam:  Osteotomies sites appear to be stable with routine healing.   Assessment: 1. s/p ankle arthroscopy and 2nd met osteotomy right. DOS: 01/05/2019   Plan of Care:  1. Patient was evaluated. X-Rays reviewed.  2. Return to work full activity with no restrictions beginning on 03/20/2019.  3. Recommended good shoe gear.  4. Continue using ankle brace as needed.  5. Return to clinic as needed.     Edrick Kins, DPM [facility]  Dr. Edrick Kins, [address]                                        [place], [place] [address]                Office [phone]  Fax [phone]

## 2019-05-22 ENCOUNTER — Other Ambulatory Visit: Payer: Self-pay

## 2019-05-22 ENCOUNTER — Ambulatory Visit (INDEPENDENT_AMBULATORY_CARE_PROVIDER_SITE_OTHER): Payer: 59

## 2019-05-22 ENCOUNTER — Ambulatory Visit: Payer: 59 | Admitting: Podiatry

## 2019-05-22 DIAGNOSIS — M19071 Primary osteoarthritis, right ankle and foot: Secondary | ICD-10-CM | POA: Diagnosis not present

## 2019-05-22 DIAGNOSIS — M659 Synovitis and tenosynovitis, unspecified: Secondary | ICD-10-CM | POA: Diagnosis not present

## 2019-05-23 ENCOUNTER — Telehealth: Payer: Self-pay | Admitting: *Deleted

## 2019-05-23 DIAGNOSIS — M19071 Primary osteoarthritis, right ankle and foot: Secondary | ICD-10-CM

## 2019-05-23 DIAGNOSIS — T148XXA Other injury of unspecified body region, initial encounter: Secondary | ICD-10-CM

## 2019-05-23 NOTE — Telephone Encounter (Signed)
Orders to G. Martin, CMA for pre-cert, faxed to [facility]. 

## 2019-05-23 NOTE — Telephone Encounter (Signed)
-----   Message from Edrick Kins, DPM sent at 05/22/2019  5:05 PM EST ----- Regarding: MRI RT ankle w/out contrast Please order MRI RT ankle without contrast.   Dx: possible ATFL tear RT. Chronic ankle pain RT  Thanks, Dr. Amalia Hailey

## 2019-05-25 NOTE — Progress Notes (Signed)
   Subjective:  48 y.o. male presenting today for follow up evaluation of right lateral foot and ankle pain and weakness. He states being active on the foot makes the symptoms worse. He has been taking Meloxicam and using an ankle brace. He reports having right ankle arthroscopy on 01/05/2019. Patient is here for further evaluation and treatment.   No past medical history on file.   Objective / Physical Exam:  General:  The patient is alert and oriented x3 in no acute distress. Dermatology:  Skin is warm, dry and supple bilateral lower extremities. Negative for open lesions or macerations. Vascular:  Palpable pedal pulses bilaterally. No edema or erythema noted. Capillary refill within normal limits. Neurological:  Epicritic and protective threshold grossly intact bilaterally.  Musculoskeletal Exam:  Pain on palpation to the anterior lateral medial aspects of the patient's right ankle. Mild edema noted. Range of motion within normal limits to all pedal and ankle joints bilateral. Muscle strength 5/5 in all groups bilateral.   Radiographic Exam:  Normal osseous mineralization. Joint spaces preserved. No fracture/dislocation/boney destruction.    Assessment: 1. Right ankle synovitis  2. H/o right ankle arthroscopy DOS: 01/05/2019  Plan of Care:  1. Patient was evaluated. X-Rays reviewed.  2. Injection of 0.5 mL Celestone Soluspan injected in the patient's right ankle. 3. Continue taking Meloxicam as prescribed by spine specialist.  4. MRI of the right ankle ordered.  5. Return to clinic after MRI to review results.    Edrick Kins, DPM [facility]  Dr. Edrick Kins, [address]                                        [place], [place] [address]                Office [phone]  Fax [phone]

## 2019-06-06 NOTE — Telephone Encounter (Signed)
Received notification stating Dr. Amalia Hailey is required to perform Physician to Physician discussion prior to possible approval for the MRI of right ankle [id number], Case:  [id number], before 06/07/2019. This notification was received 06/04/2019 Sunday and not presented to me from the main fax until 06/05/2019. Request for Physician to Physician routed to Dr. Amalia Hailey and assistants 06/06/2019. Faxed copy of this message to Dr. Amalia Hailey in [place] with copy of letter.

## 2019-07-04 ENCOUNTER — Other Ambulatory Visit: Payer: Self-pay

## 2019-07-04 ENCOUNTER — Ambulatory Visit
Admission: RE | Admit: 2019-07-04 | Discharge: 2019-07-04 | Disposition: A | Discharge status: Home / Self Care | Payer: 59 | Source: Ambulatory Visit | Attending: Podiatry | Admitting: Podiatry

## 2019-07-04 DIAGNOSIS — M19071 Primary osteoarthritis, right ankle and foot: Secondary | ICD-10-CM

## 2019-07-04 DIAGNOSIS — T148XXA Other injury of unspecified body region, initial encounter: Secondary | ICD-10-CM

## 2019-07-10 ENCOUNTER — Telehealth: Payer: Self-pay | Admitting: Podiatry

## 2019-07-10 NOTE — Telephone Encounter (Signed)
Pt called following up on MRI and to see if results are in. Please give patient a call.

## 2019-07-10 NOTE — Telephone Encounter (Signed)
I reviewed clinicals and MRI results are available and Dr. Logan Bores wanted pt to make an appt to discuss.  Left message informing pt he would need an appt and the results were available.

## 2019-07-11 ENCOUNTER — Ambulatory Visit: Payer: 59 | Admitting: Podiatry

## 2019-07-11 ENCOUNTER — Other Ambulatory Visit: Payer: Self-pay

## 2019-07-11 DIAGNOSIS — M659 Synovitis and tenosynovitis, unspecified: Secondary | ICD-10-CM | POA: Diagnosis not present

## 2019-07-11 DIAGNOSIS — M19071 Primary osteoarthritis, right ankle and foot: Secondary | ICD-10-CM | POA: Diagnosis not present

## 2019-07-11 MED ORDER — MELOXICAM 15 MG PO TABS: 15.0000 mg | ORAL_TABLET | Freq: Every day | ORAL | 3 refills | Status: AC

## 2019-07-13 NOTE — Progress Notes (Signed)
   Subjective:  49 y.o. male presenting today for follow up evaluation of right ankle pain. He states his pain is relatively unchanged and has not improved at all. Being active on the foot increases his pain. He has been taking Meloxicam as directed. He had an MRI of the right ankle on 07/04/2019. Patient is here for further evaluation and treatment.   No past medical history on file.   Objective / Physical Exam:  General:  The patient is alert and oriented x3 in no acute distress. Dermatology:  Skin is warm, dry and supple bilateral lower extremities. Negative for open lesions or macerations. Vascular:  Palpable pedal pulses bilaterally. No edema or erythema noted. Capillary refill within normal limits. Neurological:  Epicritic and protective threshold grossly intact bilaterally.  Musculoskeletal Exam:  Pain on palpation to the anterior lateral medial aspects of the patient's right ankle. Mild edema noted. Range of motion within normal limits to all pedal and ankle joints bilateral. Muscle strength 5/5 in all groups bilateral.   MRI Impression:  1. 10 x 7 mm osteochondral lesion involving the lateral talar dome. 2. Intact medial and lateral ankle ligaments and medial, lateral and anterior ankle tendons. 3. Mild tenosynovitis involving the proximal peroneal tendons. 4. Moderate fluid tracking along the flexor hallucis longus tendon which can be normal.  Assessment: 1. OCD lesion right talux  2. H/o right ankle arthroscopy DOS: 01/05/2019  Plan of Care:  1. Patient was evaluated. MRI reviewed.  2. Recommended supportive ankle brace during exercises.  3. Prescription for Meloxicam provided to patient. 4. Recommended good shoe gear.  5. Return to clinic as needed.     Felecia Shelling, DPM [facility]  Dr. Felecia Shelling, DPM    [address]                                        [place], [place] [address]                Office [phone]  Fax [phone]

## 2019-11-15 ENCOUNTER — Other Ambulatory Visit: Payer: Self-pay

## 2019-11-15 ENCOUNTER — Encounter: Payer: Self-pay | Admitting: Podiatry

## 2019-11-15 ENCOUNTER — Ambulatory Visit: Payer: 59 | Admitting: Podiatry

## 2019-11-15 DIAGNOSIS — M19071 Primary osteoarthritis, right ankle and foot: Secondary | ICD-10-CM

## 2019-11-15 DIAGNOSIS — M659 Synovitis and tenosynovitis, unspecified: Secondary | ICD-10-CM | POA: Diagnosis not present

## 2019-11-15 MED ORDER — IBUPROFEN 800 MG PO TABS: 800.0000 mg | ORAL_TABLET | Freq: Three times a day (TID) | ORAL | 1 refills | Status: DC

## 2019-11-19 NOTE — Progress Notes (Signed)
   Subjective:  49 y.o. male presenting today for follow up evaluation of right ankle pain.  Patient states that he has been performing activity however approximately 1 week ago he did have some pain and tenderness to the ankle.  The pain seemed today he does not have any significant pain associated with his ankle.  Resolved over the the following day by icing and wearing the ankle brace.  No past medical history on file.   Objective / Physical Exam:  General:  The patient is alert and oriented x3 in no acute distress. Dermatology:  Skin is warm, dry and supple bilateral lower extremities. Negative for open lesions or macerations. Vascular:  Palpable pedal pulses bilaterally. No edema or erythema noted. Capillary refill within normal limits. Neurological:  Epicritic and protective threshold grossly intact bilaterally.  Musculoskeletal Exam:  Negative for any significant pain on palpation to the anterior lateral medial aspects of the patient's right ankle. Mild edema noted. Range of motion within normal limits to all pedal and ankle joints bilateral. Muscle strength 5/5 in all groups bilateral.   MRI Impression 07/04/2019:  1. 10 x 7 mm osteochondral lesion involving the lateral talar dome. 2. Intact medial and lateral ankle ligaments and medial, lateral and anterior ankle tendons. 3. Mild tenosynovitis involving the proximal peroneal tendons. 4. Moderate fluid tracking along the flexor hallucis longus tendon which can be normal.  Assessment: 1. OCD lesion right talus 2. H/o right ankle arthroscopy DOS: 01/05/2019 3.  DJD right ankle  Plan of Care:  1. Patient was evaluated. 2. Recommended supportive ankle brace during exercises.  3.  Prescription for Motrin 800 3 times daily 4.  Return to clinic as needed  Felecia Shelling, DPM [facility]  Dr. Felecia Shelling, DPM    [address]                                        [place], [place] [address]                  Office [phone]  Fax [phone]

## 2020-10-14 ENCOUNTER — Ambulatory Visit: Payer: 59 | Admitting: Podiatry

## 2020-10-14 ENCOUNTER — Ambulatory Visit (INDEPENDENT_AMBULATORY_CARE_PROVIDER_SITE_OTHER): Payer: 59

## 2020-10-14 ENCOUNTER — Other Ambulatory Visit: Payer: Self-pay

## 2020-10-14 DIAGNOSIS — M659 Synovitis and tenosynovitis, unspecified: Secondary | ICD-10-CM

## 2020-10-14 DIAGNOSIS — M7751 Other enthesopathy of right foot: Secondary | ICD-10-CM

## 2020-10-14 NOTE — Progress Notes (Signed)
   Subjective:  50 y.o. male presenting today for intermittent pain to the right forefoot with cramping.  Patient states when he plays tennis 2 times a week he starts to get some cramping to his foot.  He denies history of injury.  He does have history of second metatarsal surgery.  He has not done anything for treatment.   No past medical history on file.   Objective / Physical Exam:  General:  The patient is alert and oriented x3 in no acute distress. Dermatology:  Skin is warm, dry and supple bilateral lower extremities. Negative for open lesions or macerations. Vascular:  Palpable pedal pulses bilaterally. No edema or erythema noted. Capillary refill within normal limits. Neurological:  Epicritic and protective threshold grossly intact bilaterally.  Musculoskeletal Exam:  There is some pain on palpation of the second MTPJ right foot  Radiographic exam: Good alignment of the metatarsal parabola.  Orthopedic screw to the second metatarsal is stable.  Good healing noted.  No degenerative changes noted to the joint spaces  Assessment: 1. OCD lesion right talux  2. H/o right ankle arthroscopy DOS: 01/05/2019 3. H/o second metatarsal Weil shortening osteotomy RT   Plan of Care:  1. Patient was evaluated.  2.  Patient only has intermittent pain especially throughout the day when he is walking barefoot in his house on hardwood floors. 3.  Discontinue walking barefoot on hardwood floors 4.  Recommend good stability shoes 5.  Patient declined any oral or injectable anti-inflammatory 6.  Return to clinic as needed  Felecia Shelling, DPM [facility]  Dr. Felecia Shelling, DPM    [address] [place], [place] [address]                Office [phone]  Fax [phone]

## 2020-12-02 ENCOUNTER — Other Ambulatory Visit: Payer: Self-pay | Admitting: Podiatry

## 2020-12-02 ENCOUNTER — Telehealth: Payer: Self-pay | Admitting: Podiatry

## 2020-12-02 MED ORDER — IBUPROFEN 800 MG PO TABS: 800.0000 mg | ORAL_TABLET | Freq: Three times a day (TID) | ORAL | 1 refills | Status: DC

## 2020-12-02 NOTE — Telephone Encounter (Signed)
Mr. Rehm, relocated to [facility]. He just wanted to speak with you about a couple of questions he still has

## 2021-08-04 IMAGING — MR MR ANKLE*R* W/O CM
4 of 5 series · 13 of 40 positions shown · non-contrast
Comparison: Radiographs 05/22/2019

CLINICAL DATA: Chronic persistent lateral ankle pain. History of
surgery in December 2018.

EXAM:
MRI OF THE RIGHT ANKLE WITHOUT CONTRAST
TECHNIQUE: Multiplanar, multisequence MR imaging of the ankle was performed. No
intravenous contrast was administered.

[Series 3: PD fat-sat · axial · right · 3.3mm · 0.25mm/px · z∈[-91,+18]mm · 4 of 30 slices shown]
[im 1/30]
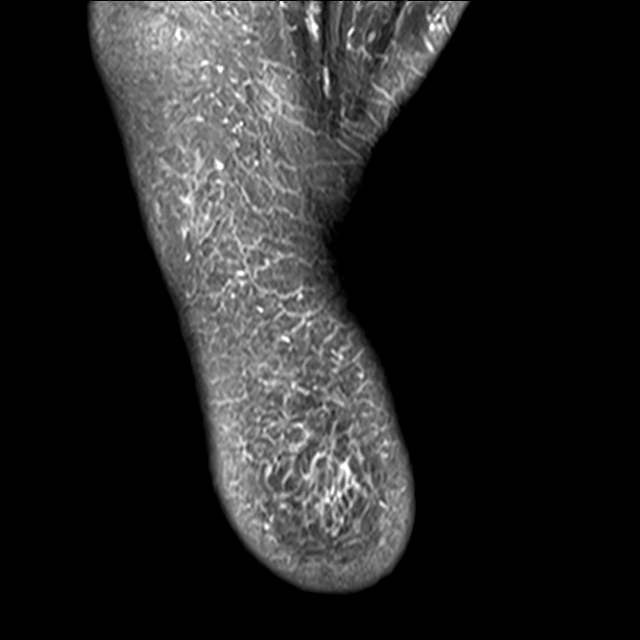
[im 4/30]
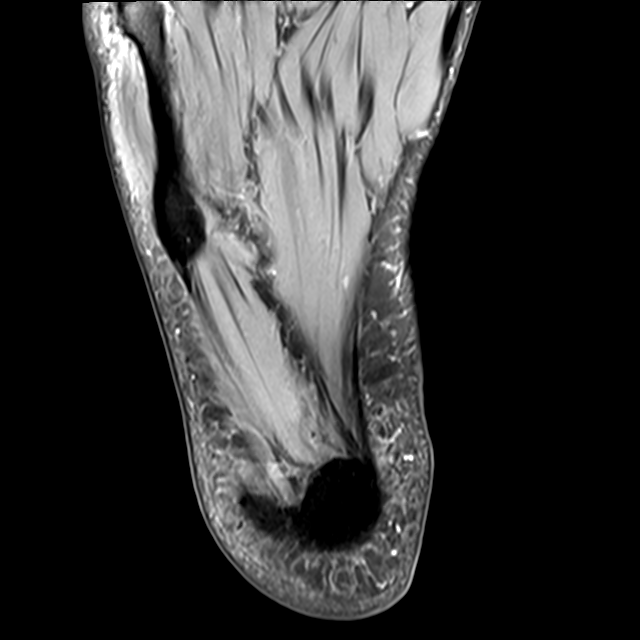
[im 15/30]
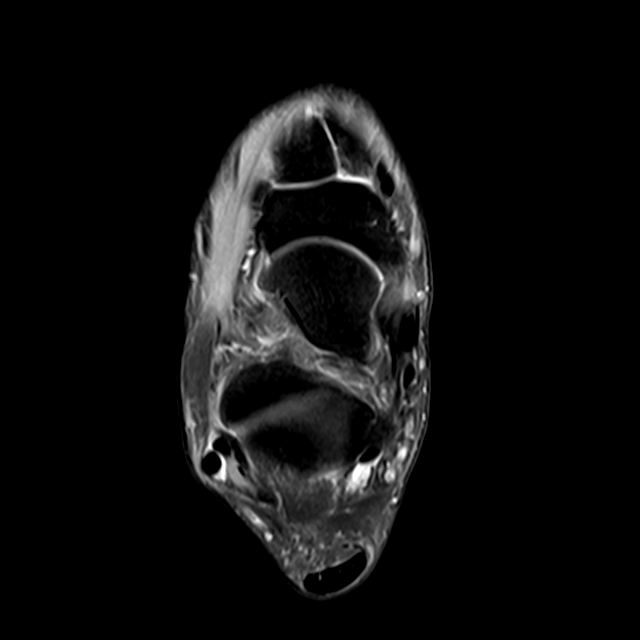
[im 26/30]
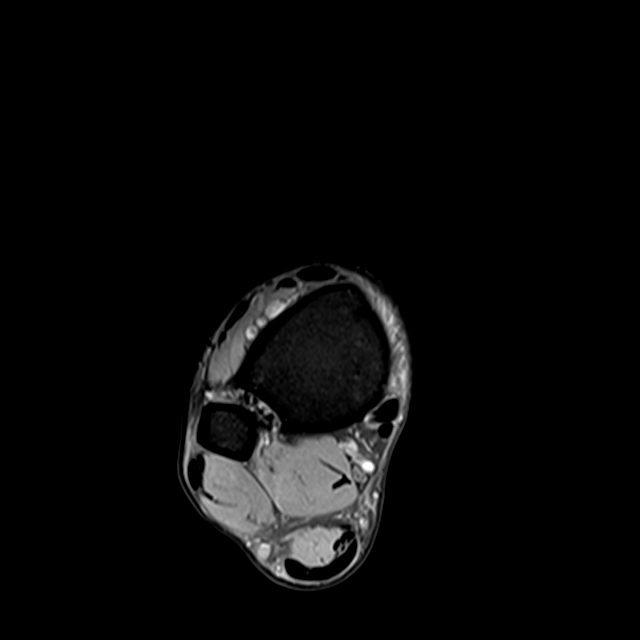

[Series 4: T2 fat-sat · axial · right · 3.3mm · 0.25mm/px · z∈[-78,+18]mm · 3 of 30 slices shown (1 of 2)]
[im 4/30]
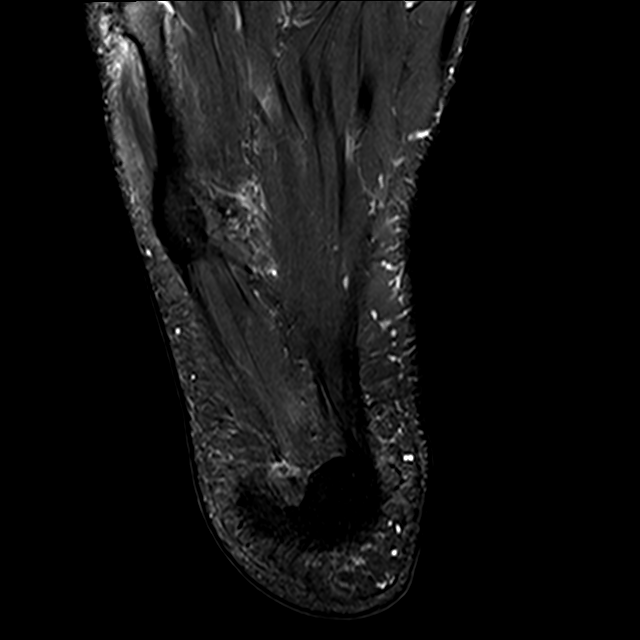
[im 15/30]
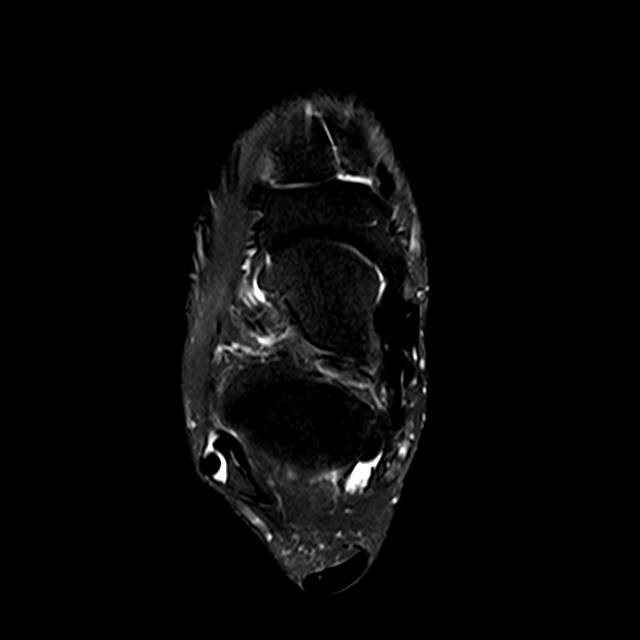
[im 26/30]
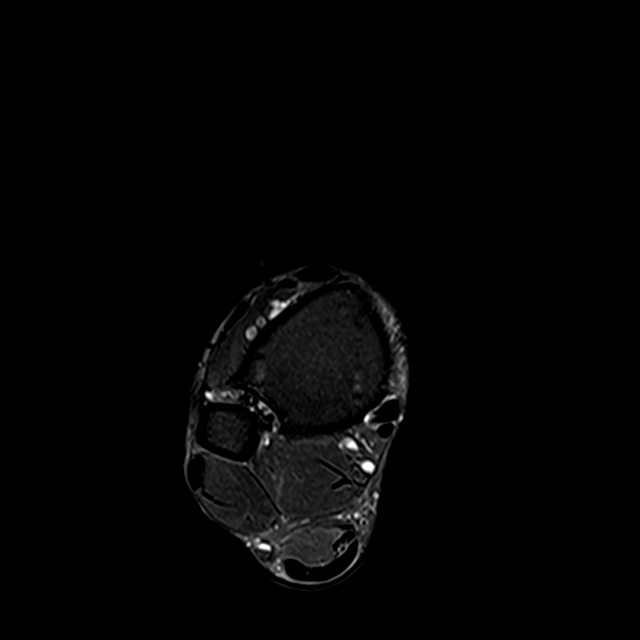

[Series 5: T1 · sagittal · right · 4.0mm · 0.27mm/px · 3 of 18 slices shown]
[im 4/18]
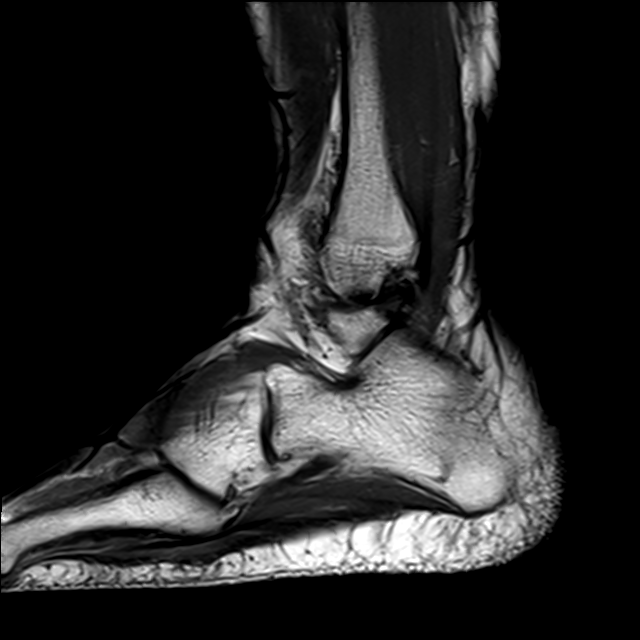
[im 11/18]
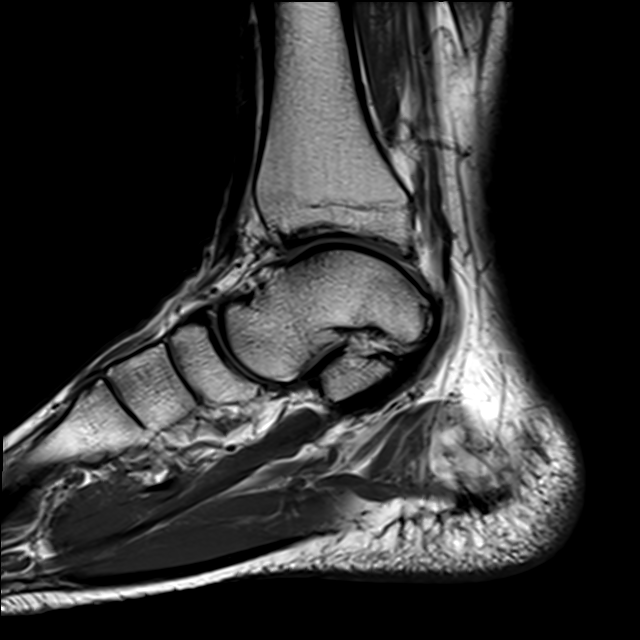
[im 18/18]
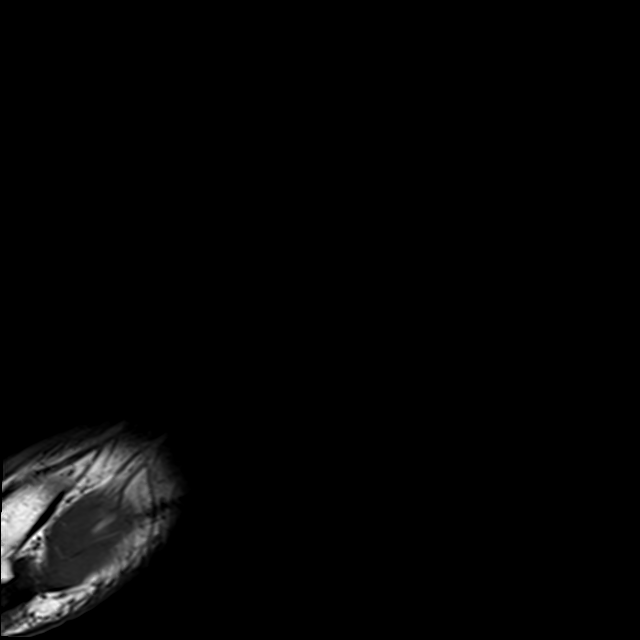

[Series 7: T2 fat-sat · coronal · right · 3.0mm · 0.25mm/px · 3 of 33 slices shown (2 of 2)]
[im 4/33]
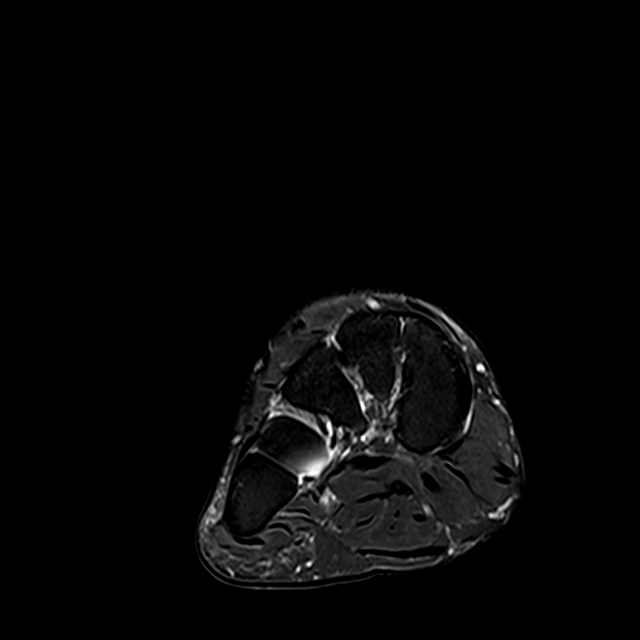
[im 18/33]
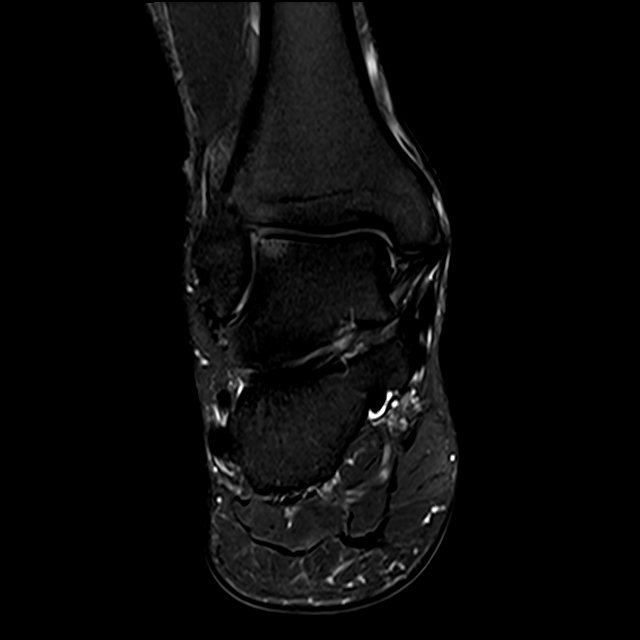
[im 29/33]
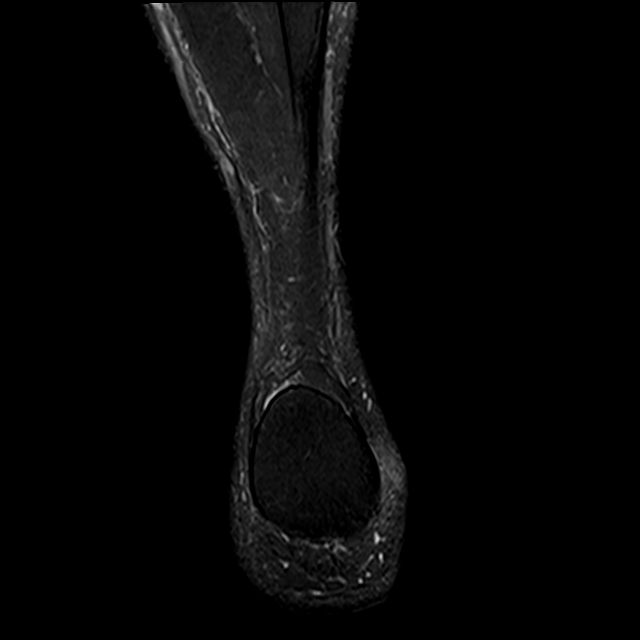

[13 of 40 positions shown; findings below may reference images not displayed]

FINDINGS: TENDONS

Peroneal: Intact.  Moderate proximal tenosynovitis.

Posteromedial: Intact. Moderate fluid tracking along the flexor
hallucis longus tendon which can be normal.

Anterior: Intact.  No significant tendinopathy or tenosynovitis.

Achilles: Intact.  No tendinopathy or tear.

Plantar Fascia: Intact.  No findings for plantar fasciitis.

LIGAMENTS

Lateral: Intact

Medial: Intact

CARTILAGE

Ankle Joint: There is an osteochondral lesion involving the lateral
talar dome. This measures approximately 10 x 7 mm and there is
surrounding edema like signal changes. Overlying cartilage defect is
suspected. No joint effusion.

Subtalar Joints/Sinus Tarsi: The subtalar joints are maintained.
Sinus tarsi is normal. The cervical and interosseous ligaments are
intact. The spring ligament is intact.

Bones: Osteochondral lesion involving the talus but no stress
fracture or AVN involving the other bony structures of the ankle or
midfoot or hindfoot.

Other: Unremarkable appearance of the foot and ankle musculature.
IMPRESSION: 1. 10 x 7 mm osteochondral lesion involving the lateral talar dome.
2. Intact medial and lateral ankle ligaments and medial, lateral and
anterior ankle tendons.
3. Mild tenosynovitis involving the proximal peroneal tendons.
4. Moderate fluid tracking along the flexor hallucis longus tendon
which can be normal.

## 2022-04-20 ENCOUNTER — Telehealth: Payer: Self-pay | Admitting: Podiatry

## 2022-04-20 NOTE — Telephone Encounter (Signed)
Please schedule for appointment.

## 2022-04-20 NOTE — Telephone Encounter (Signed)
Pt wanting a refill on Rx Ibuprofen 800. Pt was last seen 10/2020.  Please advise.

## 2022-04-20 NOTE — Telephone Encounter (Signed)
Patient no longer lives locally.  Recommend follow-up with local physician for the refill of the Motrin 800 mg.  Please notify patient.

## 2022-04-21 ENCOUNTER — Other Ambulatory Visit: Payer: Self-pay | Admitting: Podiatry

## 2022-04-21 MED ORDER — IBUPROFEN 800 MG PO TABS: 800.0000 mg | ORAL_TABLET | Freq: Three times a day (TID) | ORAL | 1 refills | Status: AC
